# Patient Record
Sex: Male | Born: 1970 | Hispanic: Yes | Marital: Married | Smoking: Never smoker
Health system: Southern US, Community
[De-identification: ages and names within clinical notes are randomized; demographics above are authoritative.]

## PROBLEM LIST (undated history)

## (undated) HISTORY — PX: APPENDECTOMY: SHX54

---

## 2005-08-06 ENCOUNTER — Inpatient Hospital Stay: Payer: Self-pay | Admitting: Surgery

## 2005-08-13 ENCOUNTER — Emergency Department: Payer: Self-pay | Admitting: Emergency Medicine

## 2010-10-05 ENCOUNTER — Emergency Department: Payer: Self-pay

## 2013-12-26 ENCOUNTER — Emergency Department (HOSPITAL_COMMUNITY): Payer: No Typology Code available for payment source

## 2013-12-26 ENCOUNTER — Emergency Department (HOSPITAL_COMMUNITY)
Admission: EM | Admit: 2013-12-26 | Discharge: 2013-12-26 | Disposition: A | Discharge status: Home / Self Care | Payer: No Typology Code available for payment source | Attending: Emergency Medicine | Admitting: Emergency Medicine

## 2013-12-26 ENCOUNTER — Encounter (HOSPITAL_COMMUNITY): Payer: Self-pay | Admitting: Emergency Medicine

## 2013-12-26 DIAGNOSIS — Y9389 Activity, other specified: Secondary | ICD-10-CM | POA: Insufficient documentation

## 2013-12-26 DIAGNOSIS — IMO0002 Reserved for concepts with insufficient information to code with codable children: Secondary | ICD-10-CM | POA: Diagnosis not present

## 2013-12-26 DIAGNOSIS — M546 Pain in thoracic spine: Secondary | ICD-10-CM

## 2013-12-26 DIAGNOSIS — S0993XA Unspecified injury of face, initial encounter: Secondary | ICD-10-CM | POA: Insufficient documentation

## 2013-12-26 DIAGNOSIS — Y92410 Unspecified street and highway as the place of occurrence of the external cause: Secondary | ICD-10-CM | POA: Diagnosis not present

## 2013-12-26 DIAGNOSIS — S199XXA Unspecified injury of neck, initial encounter: Secondary | ICD-10-CM | POA: Diagnosis not present

## 2013-12-26 DIAGNOSIS — Z23 Encounter for immunization: Secondary | ICD-10-CM | POA: Diagnosis not present

## 2013-12-26 LAB — TROPONIN I

## 2013-12-26 LAB — CBC
HCT: 43.3 % (ref 39.0–52.0)
HEMOGLOBIN: 14.7 g/dL (ref 13.0–17.0)
MCH: 27.6 pg (ref 26.0–34.0)
MCHC: 33.9 g/dL (ref 30.0–36.0)
MCV: 81.2 fL (ref 78.0–100.0)
Platelets: 221 10*3/uL (ref 150–400)
RBC: 5.33 MIL/uL (ref 4.22–5.81)
RDW: 13.8 % (ref 11.5–15.5)
WBC: 7.5 10*3/uL (ref 4.0–10.5)

## 2013-12-26 LAB — URINALYSIS, ROUTINE W REFLEX MICROSCOPIC
BILIRUBIN URINE: NEGATIVE
GLUCOSE, UA: NEGATIVE mg/dL
KETONES UR: NEGATIVE mg/dL
Leukocytes, UA: NEGATIVE
Nitrite: NEGATIVE
PH: 6 (ref 5.0–8.0)
Protein, ur: NEGATIVE mg/dL
Specific Gravity, Urine: 1.01 (ref 1.005–1.030)
Urobilinogen, UA: 0.2 mg/dL (ref 0.0–1.0)

## 2013-12-26 LAB — BASIC METABOLIC PANEL
Anion gap: 12 (ref 5–15)
BUN: 10 mg/dL (ref 6–23)
CHLORIDE: 103 meq/L (ref 96–112)
CO2: 23 mEq/L (ref 19–32)
Calcium: 8.6 mg/dL (ref 8.4–10.5)
Creatinine, Ser: 0.87 mg/dL (ref 0.50–1.35)
GFR calc non Af Amer: >90 mL/min (ref >90)
GLUCOSE: 90 mg/dL (ref 70–99)
POTASSIUM: 3.9 meq/L (ref 3.7–5.3)
Sodium: 138 mEq/L (ref 137–147)

## 2013-12-26 LAB — URINE MICROSCOPIC-ADD ON

## 2013-12-26 MED ORDER — FLUORESCEIN SODIUM 1 MG OP STRP
1.0000 | ORAL_STRIP | Freq: Once | OPHTHALMIC | Status: AC
  Administered 2013-12-26: 1 via OPHTHALMIC
  Filled 2013-12-26: qty 1

## 2013-12-26 MED ORDER — TETANUS-DIPHTH-ACELL PERTUSSIS 5-2.5-18.5 LF-MCG/0.5 IM SUSP
0.5000 mL | Freq: Once | INTRAMUSCULAR | Status: AC
  Administered 2013-12-26: 0.5 mL via INTRAMUSCULAR
  Filled 2013-12-26: qty 0.5

## 2013-12-26 MED ORDER — IOHEXOL 300 MG/ML  SOLN
100.0000 mL | Freq: Once | INTRAMUSCULAR | Status: AC | PRN
  Administered 2013-12-26: 100 mL via INTRAVENOUS

## 2013-12-26 MED ORDER — SODIUM CHLORIDE 0.9 % IV BOLUS (SEPSIS)
1000.0000 mL | Freq: Once | INTRAVENOUS | Status: AC
  Administered 2013-12-26: 1000 mL via INTRAVENOUS

## 2013-12-26 MED ORDER — HYDROCODONE-ACETAMINOPHEN 5-325 MG PO TABS: 1.0000 | ORAL_TABLET | ORAL | Status: DC | PRN

## 2013-12-26 NOTE — ED Notes (Signed)
Pt transported to CT ?
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
## 2013-12-26 NOTE — ED Notes (Signed)
Pt requesting pain medication and a work note upon discharge.

## 2013-12-26 NOTE — ED Notes (Signed)
Orthostatic vitals complete

## 2013-12-26 NOTE — ED Notes (Signed)
Pt still with Radiology.
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
## 2013-12-26 NOTE — Discharge Instructions (Signed)
Dolor de espalda en el adulto °(Back Pain, Adult) ° El dolor de cintura es frecuente. Aproximadamente 1 de cada 5 personas lo sufren. La causa rara vez pone en peligro la vida. Con frecuencia mejora luego de algún tiempo. Alrededor de la mitad de las personas que sufren un inicio súbito de dolor de cintura, se sentirán mejor luego de 2 semanas. Aproximadamente 8 de cada 10 se sentirán mejor luego de 6 semanas.  °CAUSAS  °Algunas causas comunes son:  °· Distensión de los músculos o ligamentos que sostienen la columna vertebral. °· Desgaste (degeneración) de los discos vertebrales. °· Artritis. °· Traumatismos directos en la espalda. °DIAGNÓSTICO  °La mayor parte de las veces, la causa directa no se conoce. Sin embargo, el dolor puede tratarse efectivamente aún cuando no se conozca la causa. Una de las formas más precisas de asegurar que la causa del dolor no constituye un peligro es responder a las preguntas del médico acerca de su salud y sus síntomas. Si el médico necesita más información, podrá indicar análisis de laboratorio o realizar un diagnóstico por imágenes (radiografías o resonancia magnética). Sin embargo, aunque las imágenes muestren modificaciones, generalmente no es necesaria la cirugía.  °INSTRUCCIONES PARA EL CUIDADO EN EL HOGAR  °En algunas personas, el dolor de espalda vuelve. Como rara vez es peligroso, los pacientes pueden aprender a manejarlo ellos mismos.  °· Manténgase activo. Si permanece sentado o de pie mucho tiempo en el mismo lugar, se tensiona la espalda.  No se siente, maneje ni se quede parado en un mismo lugar por más de 30 minutos. Realice caminatas cortas en superficies planas ni bien el dolor haya cedido. Trate de aumentar cada día el tiempo que camina . °· No se quede en la cama. Si hace reposo durante más de 1 o 2 días, puede retrasar la recuperación. °· No evite los ejercicios ni el trabajo. El cuerpo está hecho para moverse. No es peligroso estar activo, aunque le duela la  espalda. La espalda se curará más rápido si continúa sus actividades antes de que el dolor se vaya. °· Preste atención a su cuerpo cuando se incline y se levante. Muchas personas sienten menos molestias cuando levantan objetos si doblan las rodillas, mantienen la carga cerca del cuerpo y evitan torcerse. Generalmente, las posiciones más cómodas son las que ejercen menos tensión en la espalda en recuperación. °· Encuentre una posición cómoda para dormir. Utilice un colchón firme y recuéstese de costado. Doble ligeramente sus rodillas. Si se recuesta sobre su espalda, coloque una almohada debajo de sus rodillas. °· Tome sólo medicamentos de venta libre o recetados, según las indicaciones del médico. Los medicamentos de venta libre para calmar el dolor y reducir la inflamación, son los que en general más ayudan. El médico podrá prescribirle relajantes musculares. Estos medicamentos calman el dolor de modo que pueda retornar a sus actividades normales y a realizar ejercicios saludables. °· Aplique hielo sobre la zona lesionada. °¨ Ponga el hielo en una bolsa plástica. °¨ Colóquese una toalla entre la piel y la bolsa de hielo. °¨ Deje la bolsa de hielo durante 15 a 20 minutos 3 a 4 veces por día, durante los primeros 2 ó 3 días. Luego podrá alternar entre calor y hielo para reducir el dolor y los espasmos. °· Consulte a su médico si puede tratar de hacer ejercicios para la espalda y recibir un masaje suave. Pueden ser beneficiosos. °· Evite sentirse ansioso o estresado. El estrés aumenta la tensión muscular y puede empeorar el dolor de espalda. Es importante reconocer cuando está ansioso o estresado y aprender la forma   de controlarlos. El ejercicio es una gran opción. °SOLICITE ATENCIÓN MÉDICA SI:  °· Siente un dolor que no se alivia con reposo o medicamentos. °· El dolor no mejora en 1 semana. °· Desarrolla nuevos síntomas. °· No se siente bien en general. °SOLICITE ATENCIÓN MÉDICA DE INMEDIATO SI: °· Siente un dolor  que se irradia desde la espalda hacia sus piernas. °· Desarrolla nuevos problemas en el intestino o la vejiga. °· Siente debilidad o adormecimiento inusual en sus brazos o piernas. °· Presenta náuseas o vómitos. °· Presenta dolor abdominal. °· Se siente desfalleciente. °Document Released: 04/05/2005 Document Revised: 10/05/2011 °[employer]® Patient Information ©2015 [employer]. This information is not intended to replace advice given to you by your health care provider. Make sure you discuss any questions you have with your health care provider. ° °

## 2013-12-26 NOTE — ED Notes (Signed)
Marissa, PA at bedside, c-spine cleared.

## 2013-12-27 NOTE — ED Provider Notes (Signed)
Medical screening examination/treatment/procedure(s) were performed by non-physician practitioner and as supervising physician I was immediately available for consultation/collaboration.   EKG Interpretation None        Shon Baton, MD 12/27/13 [phone]

## 2014-01-12 NOTE — ED Provider Notes (Signed)
Medical screening examination/treatment/procedure(s) were performed by non-physician practitioner and as supervising physician I was immediately available for consultation/collaboration.   EKG Interpretation   Date/Time:  Wednesday December 26 2013 16:44:02 EDT Ventricular Rate:  67 PR Interval:  175 QRS Duration: 95 QT Interval:  386 QTC Calculation: 407 R Axis:   -56 Text Interpretation:  Sinus rhythm LAD, consider left anterior fascicular  block ED PHYSICIAN INTERPRETATION AVAILABLE IN [facility] Confirmed  by TEST, Record ([id number]) on 12/28/2013 6:51:27 AM        Shon Baton, MD 01/12/14 [phone]

## 2015-04-13 ENCOUNTER — Emergency Department: Payer: Self-pay

## 2015-04-13 ENCOUNTER — Observation Stay
Admission: EM | Admit: 2015-04-13 | Discharge: 2015-04-14 | Disposition: A | Discharge status: Home / Self Care | Payer: Self-pay | Attending: Internal Medicine | Admitting: Internal Medicine

## 2015-04-13 ENCOUNTER — Encounter: Payer: Self-pay | Admitting: Emergency Medicine

## 2015-04-13 DIAGNOSIS — R112 Nausea with vomiting, unspecified: Secondary | ICD-10-CM | POA: Insufficient documentation

## 2015-04-13 DIAGNOSIS — K5730 Diverticulosis of large intestine without perforation or abscess without bleeding: Secondary | ICD-10-CM | POA: Insufficient documentation

## 2015-04-13 DIAGNOSIS — R55 Syncope and collapse: Principal | ICD-10-CM | POA: Insufficient documentation

## 2015-04-13 DIAGNOSIS — R079 Chest pain, unspecified: Secondary | ICD-10-CM | POA: Insufficient documentation

## 2015-04-13 DIAGNOSIS — K449 Diaphragmatic hernia without obstruction or gangrene: Secondary | ICD-10-CM | POA: Insufficient documentation

## 2015-04-13 DIAGNOSIS — I480 Paroxysmal atrial fibrillation: Secondary | ICD-10-CM | POA: Insufficient documentation

## 2015-04-13 DIAGNOSIS — J8410 Pulmonary fibrosis, unspecified: Secondary | ICD-10-CM | POA: Insufficient documentation

## 2015-04-13 DIAGNOSIS — K219 Gastro-esophageal reflux disease without esophagitis: Secondary | ICD-10-CM | POA: Insufficient documentation

## 2015-04-13 DIAGNOSIS — F10920 Alcohol use, unspecified with intoxication, uncomplicated: Secondary | ICD-10-CM

## 2015-04-13 DIAGNOSIS — K429 Umbilical hernia without obstruction or gangrene: Secondary | ICD-10-CM | POA: Insufficient documentation

## 2015-04-13 DIAGNOSIS — F10129 Alcohol abuse with intoxication, unspecified: Secondary | ICD-10-CM | POA: Insufficient documentation

## 2015-04-13 DIAGNOSIS — J322 Chronic ethmoidal sinusitis: Secondary | ICD-10-CM | POA: Insufficient documentation

## 2015-04-13 LAB — HEPATIC FUNCTION PANEL
ALBUMIN: 4.3 g/dL (ref 3.5–5.0)
ALT: 23 U/L (ref 17–63)
AST: 22 U/L (ref 15–41)
Alkaline Phosphatase: 86 U/L (ref 38–126)
Bilirubin, Direct: <0.1 mg/dL — ABNORMAL LOW (ref 0.1–0.5)
TOTAL PROTEIN: 7.6 g/dL (ref 6.5–8.1)
Total Bilirubin: 0.6 mg/dL (ref 0.3–1.2)

## 2015-04-13 LAB — CBC
HCT: 44.5 % (ref 40.0–52.0)
Hemoglobin: 14.6 g/dL (ref 13.0–18.0)
MCH: 27 pg (ref 26.0–34.0)
MCHC: 32.8 g/dL (ref 32.0–36.0)
MCV: 82.2 fL (ref 80.0–100.0)
PLATELETS: 243 10*3/uL (ref 150–440)
RBC: 5.41 MIL/uL (ref 4.40–5.90)
RDW: 14.2 % (ref 11.5–14.5)
WBC: 5.9 10*3/uL (ref 3.8–10.6)

## 2015-04-13 LAB — BASIC METABOLIC PANEL
ANION GAP: 8 (ref 5–15)
BUN: 10 mg/dL (ref 6–20)
CO2: 24 mmol/L (ref 22–32)
Calcium: 8.5 mg/dL — ABNORMAL LOW (ref 8.9–10.3)
Chloride: 110 mmol/L (ref 101–111)
Creatinine, Ser: 0.87 mg/dL (ref 0.61–1.24)
Glucose, Bld: 148 mg/dL — ABNORMAL HIGH (ref 65–99)
POTASSIUM: 3.6 mmol/L (ref 3.5–5.1)
SODIUM: 142 mmol/L (ref 135–145)

## 2015-04-13 LAB — LIPASE, BLOOD: Lipase: 17 U/L (ref 11–51)

## 2015-04-13 LAB — ETHANOL: ALCOHOL ETHYL (B): 188 mg/dL — AB (ref <5)

## 2015-04-13 LAB — TROPONIN I

## 2015-04-13 MED ORDER — SODIUM CHLORIDE 0.9 % IV BOLUS (SEPSIS)
1000.0000 mL | Freq: Once | INTRAVENOUS | Status: AC
  Administered 2015-04-13: 1000 mL via INTRAVENOUS

## 2015-04-13 MED ORDER — PROMETHAZINE HCL 25 MG/ML IJ SOLN
25.0000 mg | Freq: Once | INTRAMUSCULAR | Status: AC
  Administered 2015-04-13: 25 mg via INTRAVENOUS

## 2015-04-13 MED ORDER — PROMETHAZINE HCL 25 MG/ML IJ SOLN
INTRAMUSCULAR | Status: AC
  Administered 2015-04-13: 25 mg via INTRAVENOUS
  Filled 2015-04-13: qty 1

## 2015-04-13 MED ORDER — LORAZEPAM 1 MG PO TABS: 1.0000 mg | ORAL_TABLET | Freq: Four times a day (QID) | ORAL | Status: DC | PRN

## 2015-04-13 MED ORDER — ONDANSETRON HCL 4 MG/2ML IJ SOLN
INTRAMUSCULAR | Status: AC
  Administered 2015-04-13: 4 mg via INTRAVENOUS
  Filled 2015-04-13: qty 2

## 2015-04-13 MED ORDER — ONDANSETRON HCL 4 MG/2ML IJ SOLN
INTRAMUSCULAR | Status: AC
  Administered 2015-04-13: 16:00:00
  Filled 2015-04-13: qty 2

## 2015-04-13 MED ORDER — ONDANSETRON HCL 4 MG/2ML IJ SOLN
4.0000 mg | Freq: Once | INTRAMUSCULAR | Status: AC
  Administered 2015-04-13: 4 mg via INTRAVENOUS

## 2015-04-13 MED ORDER — LORAZEPAM 2 MG/ML IJ SOLN: 1.0000 mg | Freq: Four times a day (QID) | INTRAMUSCULAR | Status: DC | PRN

## 2015-04-13 MED ORDER — THIAMINE HCL 100 MG/ML IJ SOLN
INTRAVENOUS | Status: DC
  Filled 2015-04-13 (×2): qty 1000

## 2015-04-13 MED ORDER — IOHEXOL 350 MG/ML SOLN
100.0000 mL | Freq: Once | INTRAVENOUS | Status: AC | PRN
  Administered 2015-04-13: 100 mL via INTRAVENOUS

## 2015-04-13 MED ORDER — THIAMINE HCL 100 MG/ML IJ SOLN
INTRAVENOUS | Status: DC
  Administered 2015-04-14 (×2): via INTRAVENOUS
  Filled 2015-04-13 (×3): qty 1000

## 2015-04-13 MED ORDER — SODIUM CHLORIDE 0.9 % IV BOLUS (SEPSIS): 1000.0000 mL | Freq: Once | INTRAVENOUS | Status: AC

## 2015-04-13 NOTE — ED Provider Notes (Signed)
[facility] Emergency Department Provider Note   ____________________________________________  Time seen: Upon arrival to ED room I have reviewed the triage vital signs and the triage nursing note.  HISTORY  Chief Complaint Emesis   Historian Patient, through Spanish interpreter  HPI Christopher Ramos is a 44 y.o. male with no significant past medical history, here for vomiting and passing out. He reportedly does not drink on a daily basis, but did drink alcohol yesterday and this morning woke up heavily nausea with multiple episodes of vomiting, nonbloody and nonbilious. No fever. No trouble breathing or shortness of breath.  Complaining of some central chest pressure and pain. He passed out twice at home while retching. Denies recent fevers. Has a history of diverticulitis, but has not really been complaining about lower abdominal pain    History reviewed. No pertinent past medical history.  There are no active problems to display for this patient.   Past Surgical History  Procedure Laterality Date  . Appendectomy      Current Outpatient Rx  Name  Route  Sig  Dispense  Refill  . HYDROcodone-acetaminophen (NORCO/VICODIN) 5-325 MG per tablet   Oral   Take 1 tablet by mouth every 4 (four) hours as needed for moderate pain or severe pain.   10 tablet   0     Allergies Review of patient's allergies indicates no known allergies.  No family history on file.  Social History Social History  Substance Use Topics  . Smoking status: Never Smoker   . Smokeless tobacco: None  . Alcohol Use: Yes    Review of Systems  Constitutional: Negative for fever. Eyes: Negative for visual changes. ENT: Negative for sore throat. Cardiovascular: positivefor chest pain. Respiratory: Negative for shortness of breath. Gastrointestinal: negative for diarrhea. Genitourinary: Negative for dysuria. Musculoskeletal: Negative for back pain. Skin: Negative for  rash. Neurological: Hospitalfor headache. 10 point Review of Systems otherwise negative ____________________________________________   PHYSICAL EXAM:  VITAL SIGNS: ED Triage Vitals  Enc Vitals Group     BP 04/13/15 1502 120/77 mmHg     Pulse Rate 04/13/15 1500 95     Resp 04/13/15 1500 22     Temp 04/13/15 1502 98.7 F (37.1 C)     Temp Source 04/13/15 1502 Axillary     SpO2 04/13/15 1459 98 %     Weight 04/13/15 1502 150 lb (68.04 kg)     Height 04/13/15 1502 5\' 6"  (1.676 m)     Head Cir --      Peak Flow --      Pain Score --      Pain Loc --      Pain Edu? --      Excl. in GC? --      Constitutional: Alert and oriented. Actively retching. Eyes: Conjunctivae are normal. PERRL. Normal extraocular movements. ENT   Head: Normocephalic and atraumatic.   Nose: No congestion/rhinnorhea.   Mouth/Throat: Mucous membranes are moist.   Neck: No stridor. Cardiovascular/Chest: Normal rate, regular rhythm.  No murmurs, rubs, or gallops. Respiratory: Normal respiratory effort without tachypnea nor retractions. Breath sounds are clear and equal bilaterally. No wheezes/rales/rhonchi. Gastrointestinal: Soft. No distention, no guarding, no rebound.  Mild epigastric tenderness palpation.  Genitourinary/rectal:Deferred Musculoskeletal: Nontender with normal range of motion in all extremities. No joint effusions.  No lower extremity tenderness.  No edema. Neurologic:  Normal speech and language. No gross or focal neurologic deficits are appreciated. Skin:  Skin is warm, dry and intact.  No rash noted. Psychiatric: Mood and affect are normal. Speech and behavior are normal. Patient exhibits appropriate insight and judgment.  ____________________________________________   EKG I, Governor Rooks, MD, the attending physician have personally viewed and interpreted all ECGs.  95 bpm. Normal sinus rhythm. Narrow QRS.Left axis deviation.  Nonspecific ST and  T-wave ____________________________________________  LABS (pertinent positives/negatives)  CBC within normal limits Metabolic panel without significant abnormalities Troponin less than 0.03 Lipase 17 Hepatic panel within normal limits Alcohol level 188  ____________________________________________  RADIOLOGY All Xrays were viewed by me. Imaging interpreted by Radiologist.  CTA chest abdomen pelvis:  IMPRESSION: No CT evidence of pulmonary embolism or aortic dissection.  Apparent diffuse thickening of the colon with submucosal fat deposit which may be sequela of chronic inflammation. Clinical correlation is recommended to exclude active colitis.  Scattered colonic diverticula without active inflammation. No bowel Obstruction.  CT head noncontrast: No acute intracranial abnormality. Mild chronic ethmoid sinusitis.  Chest x-ray two-view: Negative two-view chest x-ray. __________________________________________  PROCEDURES  Procedure(s) performed: None  Critical Care performed:CRITICAL CARE Performed by: Governor Rooks   Total critical care time: 30 minutes  Critical care time was exclusive of separately billable procedures and treating other patients.  Critical care was necessary to treat or prevent imminent or life-threatening deterioration.  Critical care was time spent personally by me on the following activities: development of treatment plan with patient and/or surrogate as well as nursing, discussions with consultants, evaluation of patient's response to treatment, examination of patient, obtaining history from patient or surrogate, ordering and performing treatments and interventions, ordering and review of laboratory studies, ordering and review of radiographic studies, pulse oximetry and re-evaluation of patient's condition.   ____________________________________________   ED COURSE / ASSESSMENT AND PLAN  CONSULTATIONS: hospitalist for  admission  Pertinent labs & imaging results that were available during my care of the patient were reviewed by me and considered in my medical decision making (see chart for details).  Patient arrived actively retching, and had an episode of near syncope when his heart rate dropped into the 20s while he was retching. It seemed consistent with a vagal event. Initially unclear what the etiology of the many episodes of vomiting was from, however family states he did drink heavily last night celebrating Christmas Eve with his family. He does not have a history of drinking daily.  He required multiple repeat doses of antinausea medication, and did have another event where his heart rate dropped down into the 20s. This was in the setting of vomiting, I am most suspicious that this was part of a vagal event, however patient was placed on cardiac pads given 2 episodes very close together, and some altered mental status during that episode.  Head CT was performed given the persistent vomiting and headache, and there is no evidence of intracranial abnormality/emergency.  Patient's alcohol level came back elevated, and I suspect that his symptoms may be due to gastritis, however he required additional doses of nausea medicine.  He did report chest pain and epigastric pain, and he has had some irregularity in his heart rhythm including an episode of paroxysmal atrial fibrillation, for which she states he has no history of this.  I was unable to obtain an EKG, however I was in the room and witnessed an episode of atrial fibrillation with heart rate to the 120s.   I discussed with the family obtaining CT of the chest abdomen pelvis to rule out any vascular emergency.  CT of the chest  and pelvis was reassuring/no sign of vascular abnormalities. There is some question about possible colon inflammation, however patient has no lower abdominal pain, diarrhea, fever, or light white blood cell count I don't these haven't an  acute colitis.  For syncope, and intractable vomiting, paroxysmal atrial fibrillation episode, patient will be admitted for observation.  Patient / Family / Caregiver informed of clinical course, medical decision-making process, and agree with plan.    ___________________________________________   FINAL CLINICAL IMPRESSION(S) / ED DIAGNOSES   Final diagnoses:  Intractable vomiting with nausea, unspecified vomiting type  Alcohol intoxication, uncomplicated (HCC)  Syncope, unspecified syncope type  Nonspecific chest pain  Paroxysmal a-fib (HCC)       Governor Rooks, MD 04/13/15 2040

## 2015-04-13 NOTE — ED Notes (Signed)
Dr. Shaune PollackLord at bedside, interpreter utilized

## 2015-04-13 NOTE — H&P (Signed)
[facility] [place] [facility]   PATIENT NAME: Christopher Ramos    MR#:  [id number]  DATE OF BIRTH:  10/27/1970  DATE OF ADMISSION:  04/13/2015  PRIMARY CARE PHYSICIAN: No primary care provider on file.   REQUESTING/REFERRING PHYSICIAN: Lord  CHIEF COMPLAINT:  Nausea vomiting and passing out  HISTORY OF PRESENT ILLNESS:  Christopher Ramos  is a 44 y.o. male with no known medical problems presenting to the ED with a chief complaint of intractable nausea and vomiting and passing out.. According to his wife patient drinks liquor 2-3 bottles per day and his last drink was at 7:30 AM today with other friends following which he started having intractable nausea and vomiting which was nonbloody. No diarrhea or abdominal pain according to the wife's report. Patient is very lethargic and not answering any questions. He is arousable but falling asleep. Alcohol level is at 120s. CT head is negative. Initial troponin was also negative  PAST MEDICAL HISTORY:  History reviewed. No pertinent past medical history.  PAST SURGICAL HISTOIRY:   Past Surgical History  Procedure Laterality Date  . Appendectomy      SOCIAL HISTORY:   Social History  Substance Use Topics  . Smoking status: Never Smoker   . Smokeless tobacco: Not on file  . Alcohol Use: Yes    FAMILY HISTORY:  No family history on file.  DRUG ALLERGIES:  No Known Allergies  REVIEW OF SYSTEMS:  Review of systems unobtainable as the patient is lethargic from alcohol intoxication  MEDICATIONS AT HOME:   Prior to Admission medications   Not on File      VITAL SIGNS:  Blood pressure 90/61, pulse 91, temperature 98.7 F (37.1 C), temperature source Axillary, resp. rate 14, height 5\' 6"  (1.676 m), weight 68.04 kg (150 lb), SpO2 98 %.  PHYSICAL EXAMINATION:  GENERAL:  44 y.o.-year-old patient lying in the bed with no acute distress.  EYES: Pupils equal, round, reactive to light and  accommodation. No scleral icterus.  HEENT: Head atraumatic, normocephalic. Oropharynx and nasopharynx clear.  NECK:  Supple, no jugular venous distention. No thyroid enlargement, no tenderness.  LUNGS: Normal breath sounds bilaterally, no wheezing, rales,rhonchi or crepitation. No use of accessory muscles of respiration.  CARDIOVASCULAR: S1, S2 normal. No murmurs, rubs, or gallops.  ABDOMEN: Soft, nontender, nondistended. Bowel sounds present. No organomegaly or mass.  EXTREMITIES: No pedal edema, cyanosis, or clubbing.  NEUROLOGIC: Patient is lethargic  PSYCHIATRIC: The patient is Lethargic  SKIN: No obvious rash, lesion, or ulcer.   LABORATORY PANEL:   CBC  Recent Labs Lab 04/13/15 1459  WBC 5.9  HGB 14.6  HCT 44.5  PLT 243   ------------------------------------------------------------------------------------------------------------------  Chemistries   Recent Labs Lab 04/13/15 1459  NA 142  K 3.6  CL 110  CO2 24  GLUCOSE 148*  BUN 10  CREATININE 0.87  CALCIUM 8.5*  AST 22  ALT 23  ALKPHOS 86  BILITOT 0.6   ------------------------------------------------------------------------------------------------------------------  Cardiac Enzymes  Recent Labs Lab 04/13/15 1459  TROPONINI <0.03   ------------------------------------------------------------------------------------------------------------------  RADIOLOGY:  Dg Chest 2 View  04/13/2015  CLINICAL DATA:  Nausea and syncope. EXAM: CHEST - 2 VIEW COMPARISON:  One-view chest x-ray 08/25/2013 FINDINGS: The heart size and mediastinal contours are within normal limits. Both lungs are clear. The visualized skeletal structures are unremarkable. IMPRESSION: Negative two view chest x-ray Electronically Signed   By: Marin Robertshristopher  Mattern M.D.   On: 04/13/2015 15:50   Ct Head  Wo Contrast  04/13/2015  CLINICAL DATA:  Syncopal episode today.  Atrial fibrillation. EXAM: CT HEAD WITHOUT CONTRAST TECHNIQUE: Contiguous  axial images were obtained from the base of the skull through the vertex without intravenous contrast. COMPARISON:  CT of the head dated 12/26/2013 FINDINGS: Brain: No evidence of acute infarction, hemorrhage, extra-axial collection, ventriculomegaly, or mass effect. Vascular: No hyperdense vessel or unexpected calcification. Skull: Negative for fracture or focal lesion. Sinuses/Orbits: Minimal polypoid mucosal thickening of the ethmoid sinuses. No acute findings otherwise. Other: None. IMPRESSION: No acute intracranial abnormality. Mild chronic ethmoid sinusitis. Electronically Signed   By: Ted Mcalpine M.D.   On: 04/13/2015 15:40   Ct Angio Chest Aorta W/cm &/or Wo/cm  04/13/2015  CLINICAL DATA:  44 year old male with chest pain and persistent nausea and vomiting. EXAM: CT ANGIOGRAPHY CHEST, ABDOMEN AND PELVIS TECHNIQUE: Multidetector CT imaging through the chest, abdomen and pelvis was performed using the standard protocol during bolus administration of intravenous contrast. Multiplanar reconstructed images and MIPs were obtained and reviewed to evaluate the vascular anatomy. CONTRAST:  OMNIPAQUE IOHEXOL 350 MG/ML SOLN COMPARISON:  CT dated 12/26/2013 FINDINGS: CTA CHEST FINDINGS The lungs are clear. A small calcified granuloma noted in the right lower lobe. There is no pleural effusion or pneumothorax. The central airways are patent. The thoracic aorta appears unremarkable. No CT evidence of pulmonary embolism. There is no cardiomegaly or pericardial effusion. There is no hilar or mediastinal adenopathy. Small amount of fluid noted in the distal esophagus, likely gastroesophageal reflux. The esophagus is otherwise grossly unremarkable. The thyroid gland appears unremarkable. There is no axillary adenopathy. The chest wall soft tissues appear unremarkable. No acute fracture Review of the MIP images confirms the above findings. CTA ABDOMEN AND PELVIS FINDINGS No intra-abdominal free air or free  fluid. The liver, gallbladder, pancreas, spleen, adrenal glands appear unremarkable. There is no hydronephrosis on either side. The visualized ureters and urinary bladder appear unremarkable. The prostate and seminal vesicles are grossly unremarkable. There is a small hiatal hernia. There scattered colonic diverticula without active inflammation. There is apparent diffuse thickening of the wall of the colon with submucosal fat deposit, likely related to chronic inflammation. No pericolonic stranding or definite evidence of active inflammation identified. Correlation with clinical exam is recommended to exclude active colitis. Is no evidence of bowel obstruction. Appendectomy. The abdominal aorta appears unremarkable. There is no aortic dissection or aneurysm. The origins of the celiac axis, SMA, IMA as well as the origins of the renal arteries are patent. 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 There is a replaced hepatic artery anatomy. The right hepatic artery arises from the SMA which then bifurcates and gives a small branch to the left lobe of the liver. There is also a small branch arising from the left gastric artery and extending into the left lobe of the liver. The IVC appears patent. No portal venous gas identified. There is no adenopathy. There is diastases of anterior abdominal wall musculature in the midline with a small fat containing umbilical hernia. The abdominal wall soft tissues are otherwise unremarkable. The osseous structures are intact. Review of the MIP images confirms the above findings. IMPRESSION: No CT evidence of pulmonary embolism or aortic dissection. Apparent diffuse thickening of the colon with submucosal fat deposit which may be sequela of chronic inflammation. Clinical correlation is recommended to exclude active colitis. Scattered colonic diverticula without active inflammation. No bowel obstruction. Electronically Signed   By: Burtis Junes  Radparvar M.D.   On: 04/13/2015 19:04   Ct Cta Abd/pel W/cm &/or W/o Cm  04/13/2015  CLINICAL DATA:  44 year old male with chest pain and persistent nausea and vomiting. EXAM: CT ANGIOGRAPHY CHEST, ABDOMEN AND PELVIS TECHNIQUE: Multidetector CT imaging through the chest, abdomen and pelvis was performed using the standard protocol during bolus administration of intravenous contrast. Multiplanar reconstructed images and MIPs were obtained and reviewed to evaluate the vascular anatomy. CONTRAST:  OMNIPAQUE IOHEXOL 350 MG/ML SOLN COMPARISON:  CT dated 12/26/2013 FINDINGS: CTA CHEST FINDINGS The lungs are clear. A small calcified granuloma noted in the right lower lobe. There is no pleural effusion or pneumothorax. The central airways are patent. The thoracic aorta appears unremarkable. No CT evidence of pulmonary embolism. There is no cardiomegaly or pericardial effusion. There is no hilar or mediastinal adenopathy. Small amount of fluid noted in the distal esophagus, likely gastroesophageal reflux. The esophagus is otherwise grossly unremarkable. The thyroid gland appears unremarkable. There is no axillary adenopathy. The chest wall soft tissues appear unremarkable. No acute fracture Review of the MIP images confirms the above findings. CTA ABDOMEN AND PELVIS FINDINGS No intra-abdominal free air or free fluid. The liver, gallbladder, pancreas, spleen, adrenal glands appear unremarkable. There is no hydronephrosis on either side. The visualized ureters and urinary bladder appear unremarkable. The prostate and seminal vesicles are grossly unremarkable. There is a small hiatal hernia. There scattered colonic diverticula without active inflammation. There is apparent diffuse thickening of the wall of the colon with submucosal fat deposit, likely related to chronic inflammation. No pericolonic stranding or definite evidence of active inflammation identified. Correlation with clinical exam is recommended to  exclude active colitis. Is no evidence of bowel obstruction. Appendectomy. The abdominal aorta appears unremarkable. There is no aortic dissection or aneurysm. The origins of the celiac axis, SMA, IMA as well as the origins of the renal arteries are patent. 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 There is a replaced hepatic artery anatomy. The right hepatic artery arises from the SMA which then bifurcates and gives a small branch to the left lobe of the liver. There is also a small branch arising from the left gastric artery and extending into the left lobe of the liver. The IVC appears patent. No portal venous gas identified. There is no adenopathy. There is diastases of anterior abdominal wall musculature in the midline with a small fat containing umbilical hernia. The abdominal wall soft tissues are otherwise unremarkable. The osseous structures are intact. Review of the MIP images confirms the above findings. IMPRESSION: No CT evidence of pulmonary embolism or aortic dissection. Apparent diffuse thickening of the colon with submucosal fat deposit which may be sequela of chronic inflammation. Clinical correlation is recommended to exclude active colitis. Scattered colonic diverticula without active inflammation. No bowel obstruction. Electronically Signed   By: Elgie Collard M.D.   On: 04/13/2015 19:04    EKG:   Orders placed or performed during the hospital encounter of 12/26/13  . EKG 12-Lead  . EKG 12-Lead  . EKG    IMPRESSION AND PLAN:   1. Syncope-probably vasovagal from nausea and vomiting Aggressive hydration with IV fluids with banana bag Monitor patient on telemetry Cycle cardiac biomarkers Check orthostatics, neuro checks. Get echocardiogram PT consult  2. Intractable nausea and vomiting probably from alcohol intoxication Provide symptomatic treatment with IV fluids, antiemetics CIWA protocol Patient will be benefited with  outpatient alcohol rehabilitation  3. Paroxysmal atrial  fibrillation-back to sinus rhythm now Check echocardiogram and monitor patient on telemetry Check TSH if needed but a cardiology consult   4. Acute alcohol intoxication  CIWA protocol IV fluids with multivitamin, thiamine and folate  Patient will be benefited with outpatient alcohol rehabilitation   Provide GI prophylaxis and DVT prophylaxis  All the records are reviewed and case discussed with ED provider. Management plans discussed with the  patient's wife at bedside with the help of Spanish interpreter mr. Isaias and she is  in agreement.  CODE STATUS: full code, wife is the healthcare power of attorney  TOTAL TIME TAKING CARE OF THIS PATIENT: 45 minutes    Rosha Cocker M.D on 04/13/2015 at 11:09 PM  Between 7am to 6pm - Pager - [phone]  After 6pm go to [email or web address] - password [email or web address] [facility]  [facility]  Office  [phone]  CC: Primary care physician; No primary care provider on file.

## 2015-04-13 NOTE — ED Notes (Signed)
Per EMS:  Nausea since waking up this morning, s/p drinking 1 1/2 fifths of taquela last night.  Patient appears alert and oriented. Started throwing up when he arrived to our facility with EMS, was only nauseated prior to that.  Emesis is blood tinged.  Denies pain.  VS and CBG are WNL, afebrile.

## 2015-04-13 NOTE — ED Notes (Signed)
Patient vomited, his HR decreased to 28 and he lost conciousness.  Patient quickly regained consciousness and his HR increased to 90s.  Patient is now in afib.

## 2015-04-14 ENCOUNTER — Observation Stay
Admit: 2015-04-14 | Discharge: 2015-04-14 | Disposition: A | Discharge status: Home / Self Care | Payer: Self-pay | Attending: Internal Medicine | Admitting: Internal Medicine

## 2015-04-14 LAB — COMPREHENSIVE METABOLIC PANEL
ALBUMIN: 3.7 g/dL (ref 3.5–5.0)
ALK PHOS: 71 U/L (ref 38–126)
ALT: 19 U/L (ref 17–63)
ANION GAP: 7 (ref 5–15)
AST: 19 U/L (ref 15–41)
BUN: 13 mg/dL (ref 6–20)
CALCIUM: 7.9 mg/dL — AB (ref 8.9–10.3)
CO2: 24 mmol/L (ref 22–32)
Chloride: 112 mmol/L — ABNORMAL HIGH (ref 101–111)
Creatinine, Ser: 0.86 mg/dL (ref 0.61–1.24)
GFR calc Af Amer: >60 mL/min (ref >60)
GFR calc non Af Amer: >60 mL/min (ref >60)
GLUCOSE: 124 mg/dL — AB (ref 65–99)
Potassium: 3.6 mmol/L (ref 3.5–5.1)
SODIUM: 143 mmol/L (ref 135–145)
Total Bilirubin: 0.7 mg/dL (ref 0.3–1.2)
Total Protein: 6.3 g/dL — ABNORMAL LOW (ref 6.5–8.1)

## 2015-04-14 LAB — CBC
HEMATOCRIT: 39.7 % — AB (ref 40.0–52.0)
HEMOGLOBIN: 13.5 g/dL (ref 13.0–18.0)
MCH: 28 pg (ref 26.0–34.0)
MCHC: 34 g/dL (ref 32.0–36.0)
MCV: 82.5 fL (ref 80.0–100.0)
Platelets: 221 10*3/uL (ref 150–440)
RBC: 4.81 MIL/uL (ref 4.40–5.90)
RDW: 14.2 % (ref 11.5–14.5)
WBC: 7.5 10*3/uL (ref 3.8–10.6)

## 2015-04-14 LAB — TROPONIN I
Troponin I: <0.03 ng/mL (ref <0.031)
Troponin I: <0.03 ng/mL (ref <0.031)

## 2015-04-14 LAB — ETHANOL: ALCOHOL ETHYL (B): 15 mg/dL — AB (ref <5)

## 2015-04-14 LAB — PROTIME-INR
INR: 1.09
Prothrombin Time: 14.3 seconds (ref 11.4–15.0)

## 2015-04-14 LAB — TSH: TSH: 0.408 u[IU]/mL (ref 0.350–4.500)

## 2015-04-14 MED ORDER — ASPIRIN EC 81 MG PO TBEC
81.0000 mg | DELAYED_RELEASE_TABLET | Freq: Every day | ORAL | Status: DC
  Administered 2015-04-14: 81 mg via ORAL
  Filled 2015-04-14: qty 1

## 2015-04-14 MED ORDER — ENOXAPARIN SODIUM 40 MG/0.4ML ~~LOC~~ SOLN
40.0000 mg | Freq: Every day | SUBCUTANEOUS | Status: DC
  Administered 2015-04-14: 40 mg via SUBCUTANEOUS
  Filled 2015-04-14: qty 0.4

## 2015-04-14 MED ORDER — ONDANSETRON HCL 4 MG/2ML IJ SOLN: 4.0000 mg | Freq: Four times a day (QID) | INTRAMUSCULAR | Status: DC | PRN

## 2015-04-14 MED ORDER — ONDANSETRON HCL 4 MG PO TABS: 4.0000 mg | ORAL_TABLET | Freq: Four times a day (QID) | ORAL | Status: DC | PRN

## 2015-04-14 NOTE — Care Management Note (Signed)
Case Management Note  Patient Details  Name: Christopher Ramos MRN: [id number] Date of Birth: 1971/03/10  Subjective/Objective:   RNCM assessment for patient without insurance. Met with patient, wife and medical interpreter Christopher Ramos) at bedside. Spanish applications given for medication management clinic and open door clinic. Explained to all that patient could complete application for medication management and pick medications up at discharge. ETOH abuse. Prior to admission patient was independent and active. No additional needs identified. Case Closed.                 Action/Plan:   Expected Discharge Date:                  Expected Discharge Plan:  Home/Self Care  In-House Referral:  Interpreting Services  Discharge planning Services  CM Consult, [facility], Medication Assistance  Post Acute Care Choice:    Choice offered to:  Patient, Spouse  DME Arranged:    DME Agency:     HH Arranged:    [place] Agency:     Status of Service:  Completed, signed off  Medicare Important Message Given:    Date Medicare IM Given:    Medicare IM give by:    Date Additional Medicare IM Given:    Additional Medicare Important Message give by:     If discussed at [place] of Stay Meetings, dates discussed:    Additional Comments:  Jolly Mango, RN 04/14/2015, 9:43 AM

## 2015-04-14 NOTE — Discharge Instructions (Signed)

## 2015-04-14 NOTE — Evaluation (Signed)
Physical Therapy Evaluation Patient Details Name: Takuya Lariccia MRN: [id number] DOB: 1970/08/31 Today's Date: 04/14/2015   History of Present Illness  Patient is a 44 y/o male that presents after experiencing nausea/vomiting and passing out after excessive alcohol consumption. Per wife present patient's BP drops when he experiences emesis, resulting in him losing consciousness.   Clinical Impression  Patient experienced a syncopal episode yesterday and was noted to have decreased HR in emergency department. Patient is independent as baseline, and demonstrates no deficits in balance or gait speed in this session. Patient does not demonstrate any decline in mobility or function during this hospitalization. PT identified no needs and will sign off.     Follow Up Recommendations No PT follow up    Equipment Recommendations       Recommendations for Other Services       Precautions / Restrictions Precautions Precautions: None Restrictions Weight Bearing Restrictions: No      Mobility  Bed Mobility Overal bed mobility: Independent             General bed mobility comments: No deficits noted.   Transfers Overall transfer level: Independent               General transfer comment: No deficits noted.   Ambulation/Gait Ambulation/Gait assistance: Independent Ambulation Distance (Feet): 200 Feet   Gait Pattern/deviations: WFL(Within Functional Limits)   Gait velocity interpretation: at or above normal speed for age/gender General Gait Details: No gait deficits, modified DGI 12/12   Stairs            Wheelchair Mobility    Modified Rankin (Stroke Patients Only)       Balance Overall balance assessment: Independent                                           Pertinent Vitals/Pain Pain Assessment: No/denies pain    Home Living Family/patient expects to be discharged to:: Private residence Living Arrangements:  Spouse/significant other Available Help at Discharge: Family           Home Equipment: None      Prior Function Level of Independence: Independent         Comments: No falls aside from the current episode, one fall last year for similar episode.      Hand Dominance        Extremity/Trunk Assessment   Upper Extremity Assessment: Overall WFL for tasks assessed           Lower Extremity Assessment: Overall WFL for tasks assessed         Communication   Communication: Interpreter utilized Best boy interpreter. )  Cognition Arousal/Alertness: Awake/alert Behavior During Therapy: WFL for tasks assessed/performed Overall Cognitive Status: Within Functional Limits for tasks assessed                      General Comments      Exercises        Assessment/Plan    PT Assessment Patent does not need any further PT services  PT Diagnosis     PT Problem List    PT Treatment Interventions     PT Goals (Current goals can be found in the Care Plan section) Acute Rehab PT Goals Patient Stated Goal: To return home  PT Goal Formulation: With patient Time For Goal Achievement: 04/28/15 Potential to Achieve Goals: Good  Frequency     Barriers to discharge        Co-evaluation               End of Session Equipment Utilized During Treatment: Gait belt Activity Tolerance: Patient tolerated treatment well        Functional Assessment Tool Used: Modified DGI, Clinical judgement  Functional Limitation: Mobility: Walking and moving around Mobility: Walking and Moving Around Current Status [phone](G8978): 0 percent impaired, limited or restricted Mobility: Walking and Moving Around Discharge Status [phone](G8980): 0 percent impaired, limited or restricted    Time: [id number] PT Time Calculation (min) (ACUTE ONLY): 6 min   Charges:   PT Evaluation $Initial PT Evaluation Tier I: 1 Procedure     PT G Codes:   PT G-Codes **NOT FOR INPATIENT  CLASS** Functional Assessment Tool Used: Modified DGI, Clinical judgement  Functional Limitation: Mobility: Walking and moving around Mobility: Walking and Moving Around Current Status ([id number](G8978): 0 percent impaired, limited or restricted Mobility: Walking and Moving Around Discharge Status ([id number](G8980): 0 percent impaired, limited or restricted   Kerin RansomPatrick A McNamara, PT, DPT    04/14/2015, 9:52 AM

## 2015-04-14 NOTE — Discharge Summary (Signed)
[facility] - [place] at [facility]  DISCHARGE SUMMARY   PATIENT NAME: Christopher Ramos    MR#:  [id number]  DATE OF BIRTH:  06/17/1970  DATE OF ADMISSION:  04/13/2015 ADMITTING PHYSICIAN: Ramonita Lab, MD  DATE OF DISCHARGE: 04/14/2015  PRIMARY CARE PHYSICIAN: No primary care provider on file.    ADMISSION DIAGNOSIS:  Paroxysmal a-fib (HCC) [I48.0] Nonspecific chest pain [R07.9] Alcohol intoxication, uncomplicated (HCC) [F10.120] Syncope, unspecified syncope type [R55] Intractable vomiting with nausea, unspecified vomiting type [R11.2]  DISCHARGE DIAGNOSIS:  Active Problems:   Syncope   SECONDARY DIAGNOSIS:  History reviewed. No pertinent past medical history.  HOSPITAL COURSE:   1. Syncope - Likely due to intoxication, vomiting, vagal response - Cardiac enzymes negative, echo normal, blood pressure normal at the time of discharge after hydration, no events on telemetry  2. Nausea and vomiting - Due to alcohol intoxication  3. Paroxysmal atrial fibrillation - Not seen since emergency room. Now normal sinus rhythm. Echo normal. TSH normal.  4. Acute alcohol intoxication - Resolved. Discussed with patient and his wife. They have declined invitation for further alcohol counseling.  Conversations had with the assistance of an interpreter  DISCHARGE CONDITIONS:   Stable  CONSULTS OBTAINED:      None  DRUG ALLERGIES:  No Known Allergies  DISCHARGE MEDICATIONS:  There are no discharge medications for this patient.  DISCHARGE INSTRUCTIONS:    DIET:  Regular diet  DISCHARGE CONDITION:  Stable  ACTIVITY:  Activity as tolerated  OXYGEN:  Home Oxygen: No.   Oxygen Delivery: room air  DISCHARGE LOCATION:  home   If you experience worsening of your admission symptoms, develop shortness of breath, life threatening emergency, suicidal or homicidal thoughts you must seek medical attention immediately by calling 911 or  calling your MD immediately  if symptoms less severe.  You Must read complete instructions/literature along with all the possible adverse reactions/side effects for all the Medicines you take and that have been prescribed to you. Take any new Medicines after you have completely understood and accpet all the possible adverse reactions/side effects.   Please note  You were cared for by a hospitalist during your hospital stay. If you have any questions about your discharge medications or the care you received while you were in the hospital after you are discharged, you can call the unit and asked to speak with the hospitalist on call if the hospitalist that took care of you is not available. Once you are discharged, your primary care physician will handle any further medical issues. Please note that NO REFILLS for any discharge medications will be authorized once you are discharged, as it is imperative that you return to your primary care physician (or establish a relationship with a primary care physician if you do not have one) for your aftercare needs so that they can reassess your need for medications and monitor your lab values.     Today   CHIEF COMPLAINT:   Chief Complaint  Patient presents with  . Emesis    HISTORY OF PRESENT ILLNESS:  Christopher Ramos is a 44 y.o. male with no known medical problems presenting to the ED with a chief complaint of intractable nausea and vomiting and passing out.. According to his wife patient drinks liquor 2-3 bottles per day and his last drink was at 7:30 AM today with other friends following which he started having intractable nausea and vomiting which was nonbloody. No diarrhea or abdominal pain according to  the wife's report. Patient is very lethargic and not answering any questions. He is arousable but falling asleep. Alcohol level is at 120s. CT head is negative. Initial troponin was also negative   VITAL SIGNS:  Blood pressure 115/73, pulse  82, temperature 98.8 F (37.1 C), temperature source Oral, resp. rate 18, height  (1.676 m), weight 75.297 kg (166 lb), SpO2 98 %.  I/O:  No intake or output data in the 24 hours ending 04/14/15 1301  PHYSICAL EXAMINATION:  GENERAL:  44 y.o.-year-old patient lying in the bed with no acute distress.  LUNGS: Normal breath sounds bilaterally, no wheezing, rales,rhonchi or crepitation. No use of accessory muscles of respiration.  CARDIOVASCULAR: S1, S2 normal. No murmurs, rubs, or gallops.  ABDOMEN: Soft, non-tender, non-distended. Bowel sounds present. No organomegaly or mass.  EXTREMITIES: No pedal edema, cyanosis, or clubbing.  NEUROLOGIC: Cranial nerves II through XII are intact. Muscle strength 5/5 in all extremities. Sensation intact. Gait not checked.  PSYCHIATRIC: The patient is alert and oriented x 3.  SKIN: No obvious rash, lesion, or ulcer.   DATA REVIEW:   CBC  Recent Labs Lab 04/14/15 0211  WBC 7.5  HGB 13.5  HCT 39.7*  PLT 221    Chemistries   Recent Labs Lab 04/14/15 0211  NA 143  K 3.6  CL 112*  CO2 24  GLUCOSE 124*  BUN 13  CREATININE 0.86  CALCIUM 7.9*  AST 19  ALT 19  ALKPHOS 71  BILITOT 0.7    Cardiac Enzymes  Recent Labs Lab 04/14/15 0729  TROPONINI <0.03    Microbiology Results  No results found for this or any previous visit.  RADIOLOGY:  Dg Chest 2 View  04/13/2015  CLINICAL DATA:  Nausea and syncope. EXAM: CHEST - 2 VIEW COMPARISON:  One-view chest x-ray 08/25/2013 FINDINGS: The heart size and mediastinal contours are within normal limits. Both lungs are clear. The visualized skeletal structures are unremarkable. IMPRESSION: Negative two view chest x-ray Electronically Signed   By: Marin Roberts M.D.   On: 04/13/2015 15:50   Ct Head Wo Contrast  04/13/2015  CLINICAL DATA:  Syncopal episode today.  Atrial fibrillation. EXAM: CT HEAD WITHOUT CONTRAST TECHNIQUE: Contiguous axial images were obtained from the base of the  skull through the vertex without intravenous contrast. COMPARISON:  CT of the head dated 12/26/2013 FINDINGS: Brain: No evidence of acute infarction, hemorrhage, extra-axial collection, ventriculomegaly, or mass effect. Vascular: No hyperdense vessel or unexpected calcification. Skull: Negative for fracture or focal lesion. Sinuses/Orbits: Minimal polypoid mucosal thickening of the ethmoid sinuses. No acute findings otherwise. Other: None. IMPRESSION: No acute intracranial abnormality. Mild chronic ethmoid sinusitis. Electronically Signed   By: Ted Mcalpine M.D.   On: 04/13/2015 15:40   Ct Angio Chest Aorta W/cm &/or Wo/cm  04/13/2015  CLINICAL DATA:  44 year old male with chest pain and persistent nausea and vomiting. EXAM: CT ANGIOGRAPHY CHEST, ABDOMEN AND PELVIS TECHNIQUE: Multidetector CT imaging through the chest, abdomen and pelvis was performed using the standard protocol during bolus administration of intravenous contrast. Multiplanar reconstructed images and MIPs were obtained and reviewed to evaluate the vascular anatomy. CONTRAST:  OMNIPAQUE IOHEXOL 350 MG/ML SOLN COMPARISON:  CT dated 12/26/2013 FINDINGS: CTA CHEST FINDINGS The lungs are clear. A small calcified granuloma noted in the right lower lobe. There is no pleural effusion or pneumothorax. The central airways are patent. The thoracic aorta appears unremarkable. No CT evidence of pulmonary embolism. There is no cardiomegaly or pericardial effusion. There  is no hilar or mediastinal adenopathy. Small amount of fluid noted in the distal esophagus, likely gastroesophageal reflux. The esophagus is otherwise grossly unremarkable. The thyroid gland appears unremarkable. There is no axillary adenopathy. The chest wall soft tissues appear unremarkable. No acute fracture Review of the MIP images confirms the above findings. CTA ABDOMEN AND PELVIS FINDINGS No intra-abdominal free air or free fluid. The liver, gallbladder, pancreas, spleen,  adrenal glands appear unremarkable. There is no hydronephrosis on either side. The visualized ureters and urinary bladder appear unremarkable. The prostate and seminal vesicles are grossly unremarkable. There is a small hiatal hernia. There scattered colonic diverticula without active inflammation. There is apparent diffuse thickening of the wall of the colon with submucosal fat deposit, likely related to chronic inflammation. No pericolonic stranding or definite evidence of active inflammation identified. Correlation with clinical exam is recommended to exclude active colitis. Is no evidence of bowel obstruction. Appendectomy. The abdominal aorta appears unremarkable. There is no aortic dissection or aneurysm. The origins of the celiac axis, SMA, IMA as well as the origins of the renal arteries are patent. 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 There is a replaced hepatic artery anatomy. The right hepatic artery arises from the SMA which then bifurcates and gives a small branch to the left lobe of the liver. There is also a small branch arising from the left gastric artery and extending into the left lobe of the liver. The IVC appears patent. No portal venous gas identified. There is no adenopathy. There is diastases of anterior abdominal wall musculature in the midline with a small fat containing umbilical hernia. The abdominal wall soft tissues are otherwise unremarkable. The osseous structures are intact. Review of the MIP images confirms the above findings. IMPRESSION: No CT evidence of pulmonary embolism or aortic dissection. Apparent diffuse thickening of the colon with submucosal fat deposit which may be sequela of chronic inflammation. Clinical correlation is recommended to exclude active colitis. Scattered colonic diverticula without active inflammation. No bowel obstruction. Electronically Signed   By: Elgie Collard M.D.   On: 04/13/2015 19:04   Ct Cta  Abd/pel W/cm &/or W/o Cm  04/13/2015  CLINICAL DATA:  44 year old male with chest pain and persistent nausea and vomiting. EXAM: CT ANGIOGRAPHY CHEST, ABDOMEN AND PELVIS TECHNIQUE: Multidetector CT imaging through the chest, abdomen and pelvis was performed using the standard protocol during bolus administration of intravenous contrast. Multiplanar reconstructed images and MIPs were obtained and reviewed to evaluate the vascular anatomy. CONTRAST:  OMNIPAQUE IOHEXOL 350 MG/ML SOLN COMPARISON:  CT dated 12/26/2013 FINDINGS: CTA CHEST FINDINGS The lungs are clear. A small calcified granuloma noted in the right lower lobe. There is no pleural effusion or pneumothorax. The central airways are patent. The thoracic aorta appears unremarkable. No CT evidence of pulmonary embolism. There is no cardiomegaly or pericardial effusion. There is no hilar or mediastinal adenopathy. Small amount of fluid noted in the distal esophagus, likely gastroesophageal reflux. The esophagus is otherwise grossly unremarkable. The thyroid gland appears unremarkable. There is no axillary adenopathy. The chest wall soft tissues appear unremarkable. No acute fracture Review of the MIP images confirms the above findings. CTA ABDOMEN AND PELVIS FINDINGS No intra-abdominal free air or free fluid. The liver, gallbladder, pancreas, spleen, adrenal glands appear unremarkable. There is no hydronephrosis on either side. The visualized ureters and urinary bladder appear unremarkable. The prostate and seminal vesicles are grossly unremarkable. There is a small hiatal hernia.  There scattered colonic diverticula without active inflammation. There is apparent diffuse thickening of the wall of the colon with submucosal fat deposit, likely related to chronic inflammation. No pericolonic stranding or definite evidence of active inflammation identified. Correlation with clinical exam is recommended to exclude active colitis. Is no evidence of bowel  obstruction. Appendectomy. The abdominal aorta appears unremarkable. There is no aortic dissection or aneurysm. The origins of the celiac axis, SMA, IMA as well as the origins of the renal arteries are patent. 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 There is a replaced hepatic artery anatomy. The right hepatic artery arises from the SMA which then bifurcates and gives a small branch to the left lobe of the liver. There is also a small branch arising from the left gastric artery and extending into the left lobe of the liver. The IVC appears patent. No portal venous gas identified. There is no adenopathy. There is diastases of anterior abdominal wall musculature in the midline with a small fat containing umbilical hernia. The abdominal wall soft tissues are otherwise unremarkable. The osseous structures are intact. Review of the MIP images confirms the above findings. IMPRESSION: No CT evidence of pulmonary embolism or aortic dissection. Apparent diffuse thickening of the colon with submucosal fat deposit which may be sequela of chronic inflammation. Clinical correlation is recommended to exclude active colitis. Scattered colonic diverticula without active inflammation. No bowel obstruction. Electronically Signed   By: Elgie CollardArash  Radparvar M.D.   On: 04/13/2015 19:04    EKG:   Orders placed or performed during the hospital encounter of 12/26/13  . EKG 12-Lead  . EKG 12-Lead  . EKG      Management plans discussed with the patient, family and they are in agreement.  CODE STATUS:     Code Status Orders        Start     Ordered   04/14/15 0144  Full code   Continuous     04/14/15 0143      TOTAL TIME TAKING CARE OF THIS PATIENT: 35 minutes.  Greater than 50% of time spent in care coordination and counseling.  Elby ShowersWALSH, CATHERINE M.D on 04/14/2015 at 1:01 PM  Between 7am to 6pm - Pager - [phone]  After 6pm go to [email or web address] - password [email or web address]  [facility]  [facility]  Office  [phone]  CC: Primary care physician; No primary care provider on file.

## 2015-04-14 NOTE — Progress Notes (Signed)
*  PRELIMINARY RESULTS* Echocardiogram 2D Echocardiogram has been performed.  Georgann HousekeeperJerry R Hege 04/14/2015, 8:13 AM

## 2017-04-11 IMAGING — CT CT HEAD W/O CM
1 series · 16 of 30 positions shown, 20 images · non-contrast
Comparison: CT of the head dated 12/26/2013

CLINICAL DATA: Syncopal episode today.  Atrial fibrillation.

EXAM:
CT HEAD WITHOUT CONTRAST
TECHNIQUE: Contiguous axial images were obtained from the base of the skull
through the vertex without intravenous contrast.

[Series 2: head wo · axial · 0.40mm/px · z∈[+102,+228]mm · 16 of 32 slices shown, 20 images]
[im 2/32  brain]
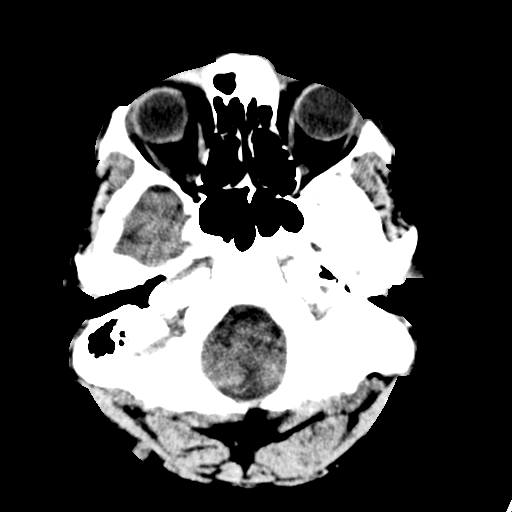
[im 2/32  bone]
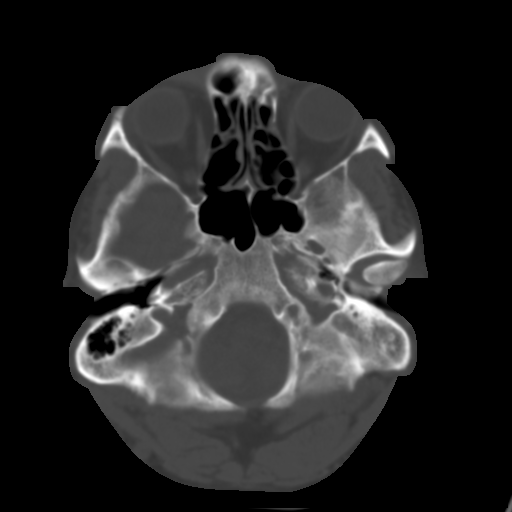
[im 4/32  brain]
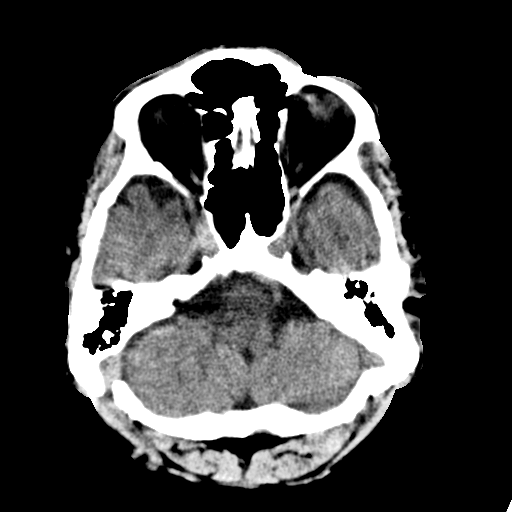
[im 6/32  brain]
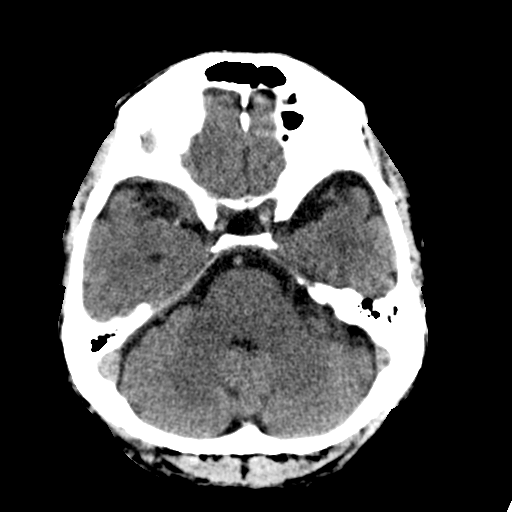
[im 8/32  brain]
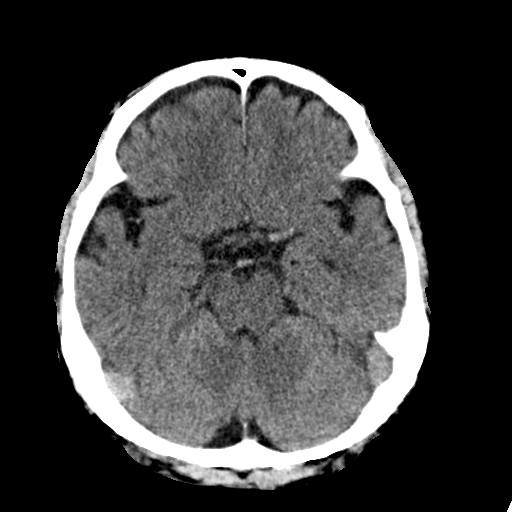
[im 9/32  brain]
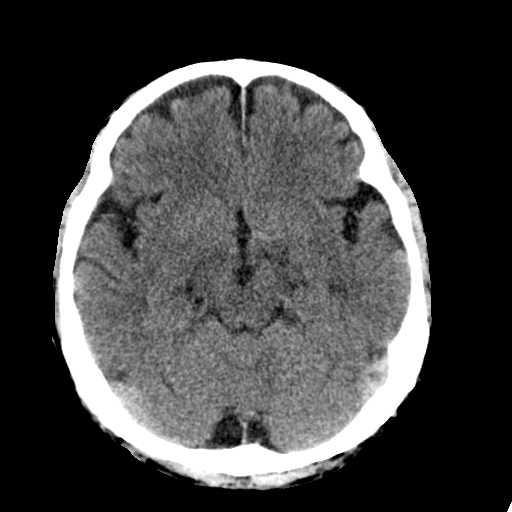
[im 9/32  bone]
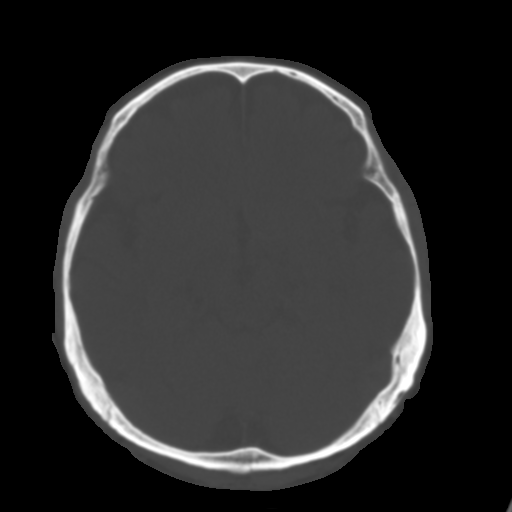
[im 11/32  brain]
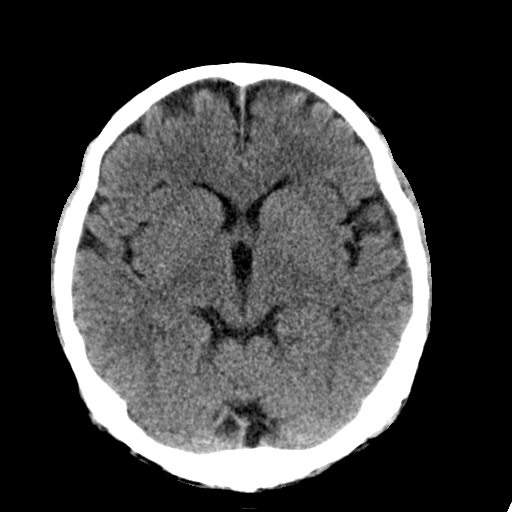
[im 13/32  brain]
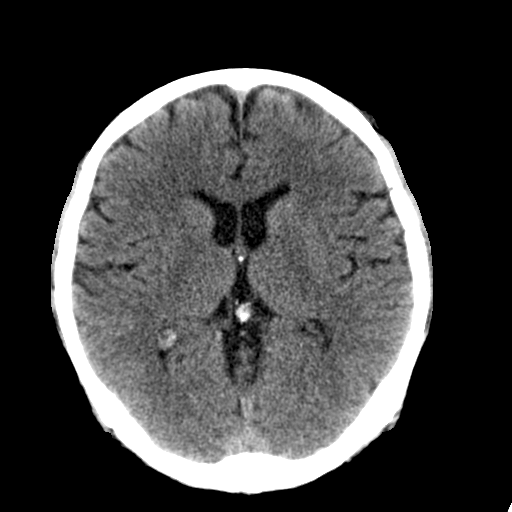
[im 15/32  brain]
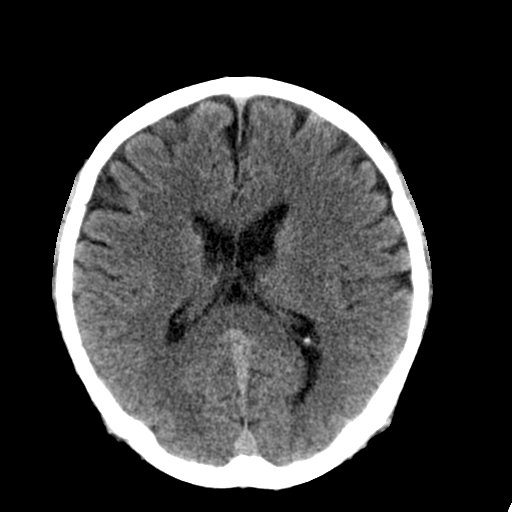
[im 17/32  brain]
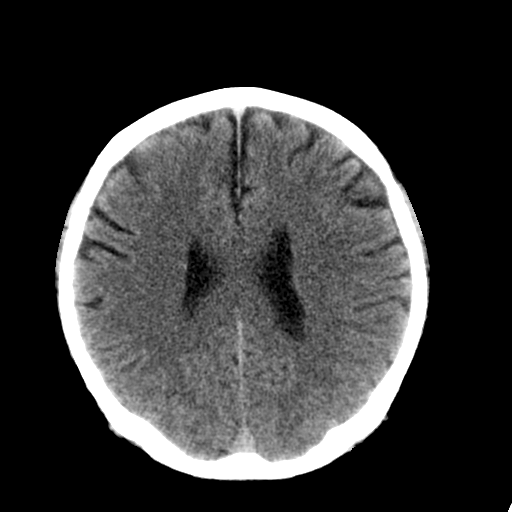
[im 17/32  bone]
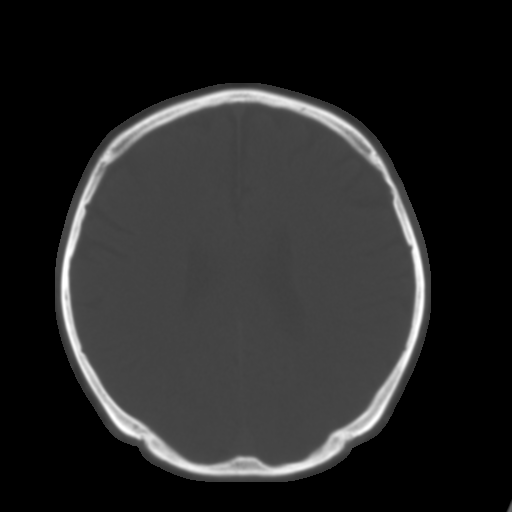
[im 19/32  brain]
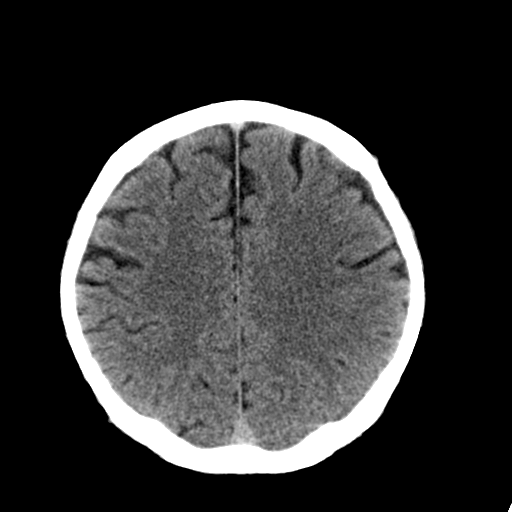
[im 21/32  brain]
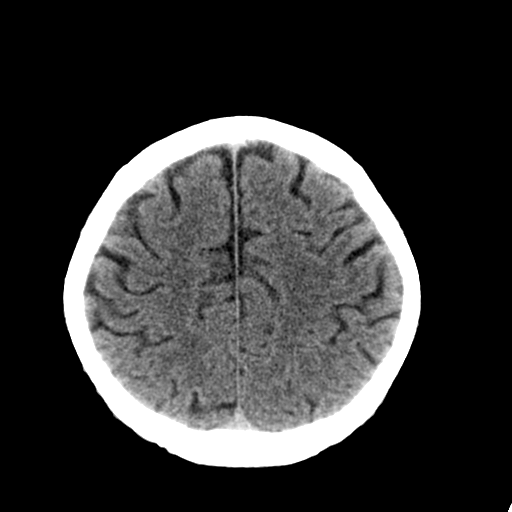
[im 23/32  brain]
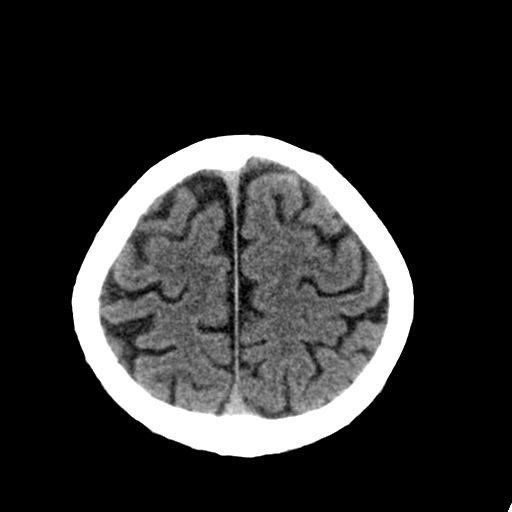
[im 24/32  brain]
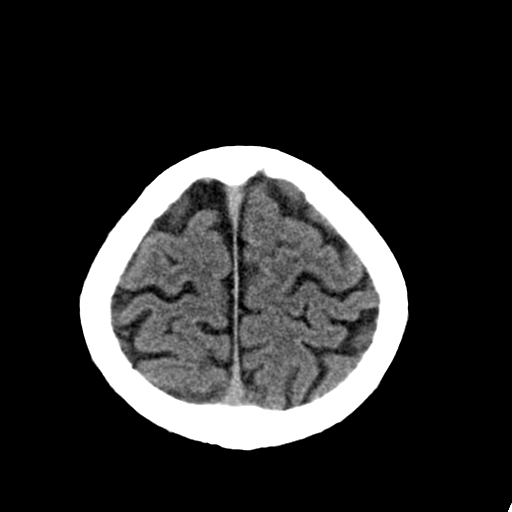
[im 24/32  bone]
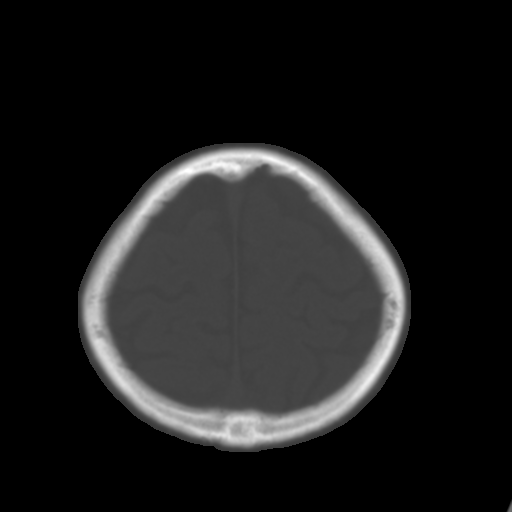
[im 26/32  brain]
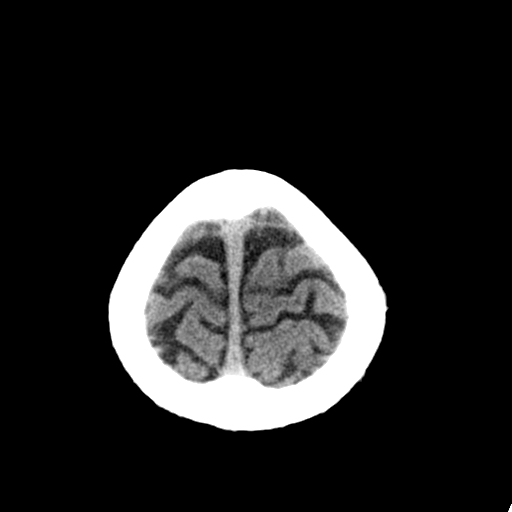
[im 28/32  brain]
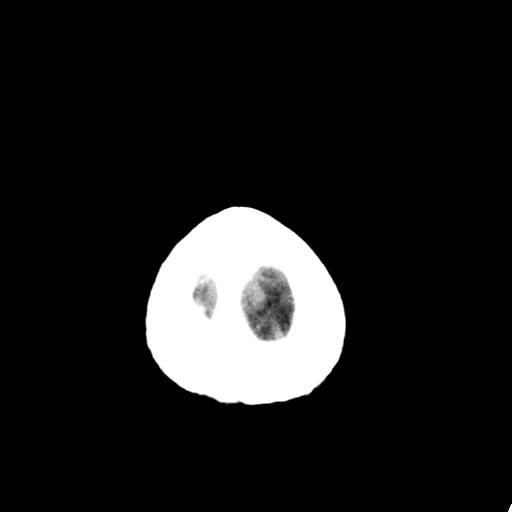
[im 30/32  brain]
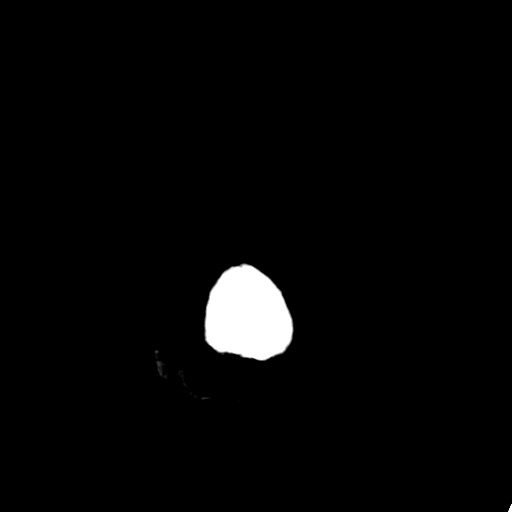

[16 of 30 positions shown; findings below may reference images not displayed]

FINDINGS: Brain: No evidence of acute infarction, hemorrhage, extra-axial
collection, ventriculomegaly, or mass effect.

Vascular: No hyperdense vessel or unexpected calcification.

Skull: Negative for fracture or focal lesion.

Sinuses/Orbits: Minimal polypoid mucosal thickening of the ethmoid
sinuses. No acute findings otherwise.

Other: None.
IMPRESSION: No acute intracranial abnormality.

Mild chronic ethmoid sinusitis.

## 2017-04-11 IMAGING — CT CT CTA ABD/PEL W/CM AND/OR W/O CM
1 of 2 series · 14 of 32 positions shown, 18 images · IV contrast (APPLIED)
Comparison: CT dated 12/26/2013

CLINICAL DATA: 44-year-old male with chest pain and persistent
nausea and vomiting.

EXAM:
CT ANGIOGRAPHY CHEST, ABDOMEN AND PELVIS
TECHNIQUE: Multidetector CT imaging through the chest, abdomen and pelvis was
performed using the standard protocol during bolus administration of
intravenous contrast. Multiplanar reconstructed images and MIPs were
obtained and reviewed to evaluate the vascular anatomy.
CONTRAST:  100mL OMNIPAQUE IOHEXOL 350 MG/ML SOLN

[Series 6: cta chest · axial · 0.66mm/px · z∈[-613,-63]mm · 14 of 311 slices shown, 18 images]
[im 24/311  soft-tissue]
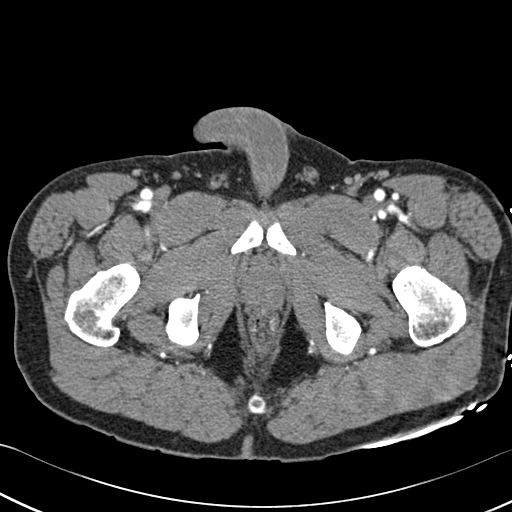
[im 24/311  bone]
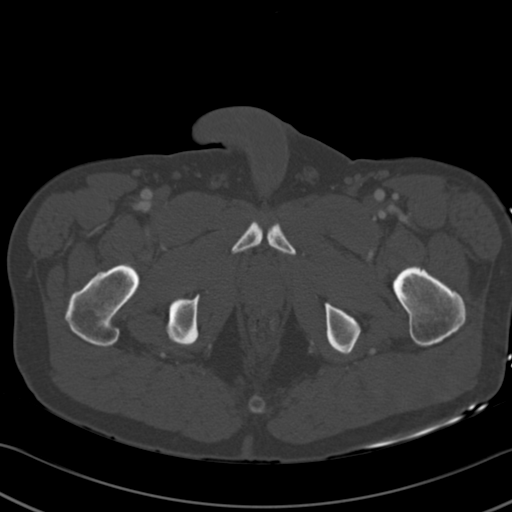
[im 48/311  soft-tissue]
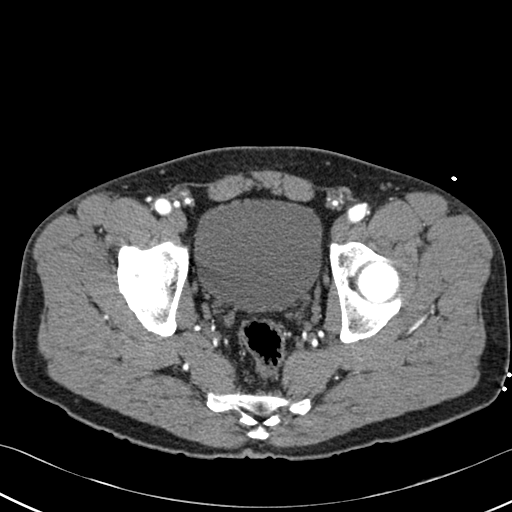
[im 72/311  soft-tissue]
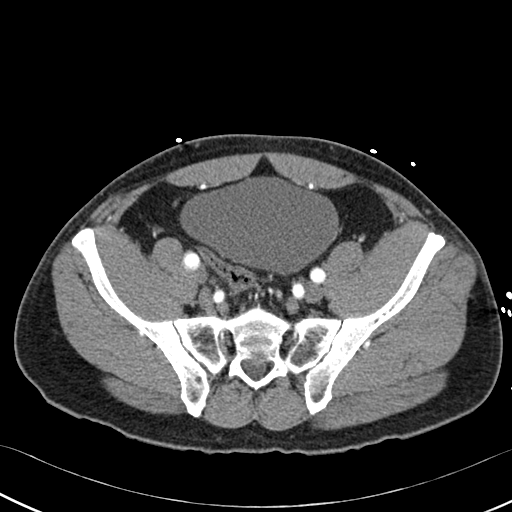
[im 96/311  soft-tissue]
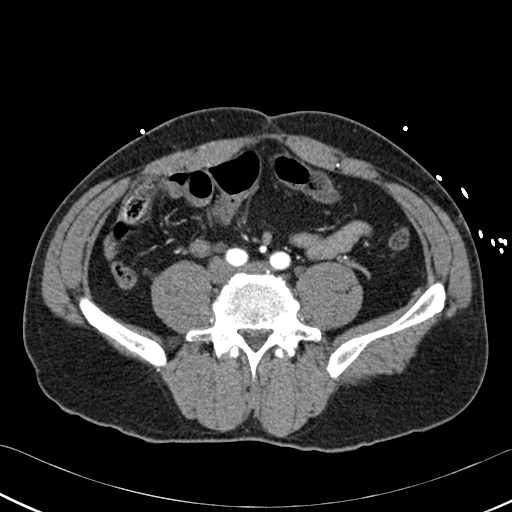
[im 120/311  soft-tissue]
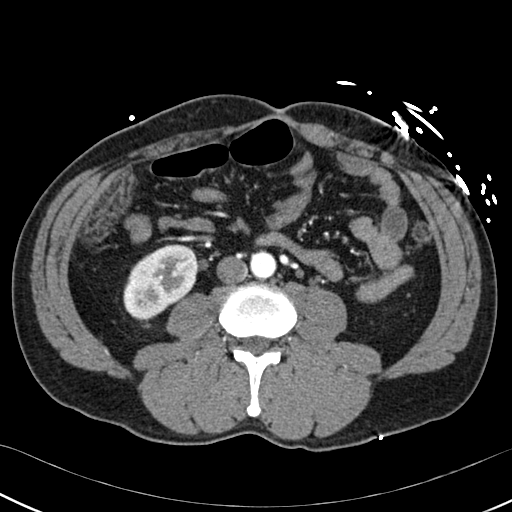
[im 144/311  soft-tissue]
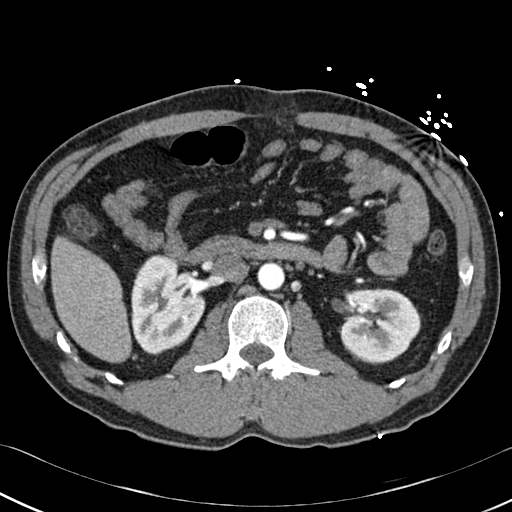
[im 167/311  soft-tissue]
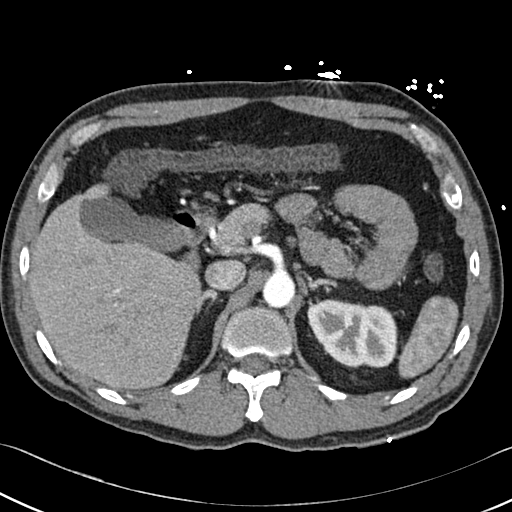
[im 191/311  soft-tissue]
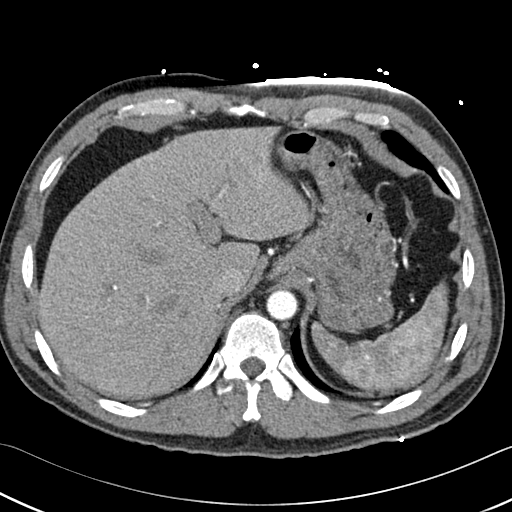
[im 215/311  soft-tissue]
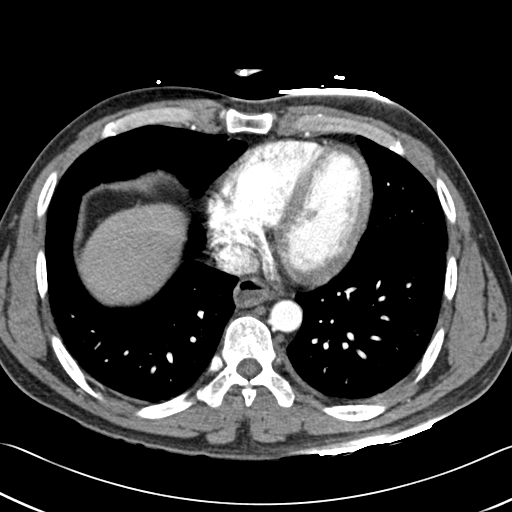
[im 215/311  bone]
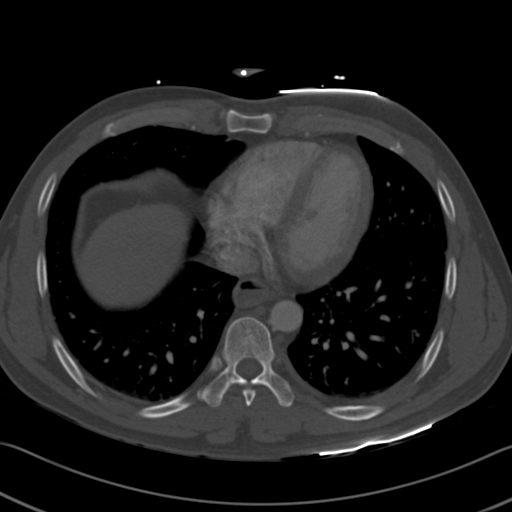
[im 239/311  soft-tissue]
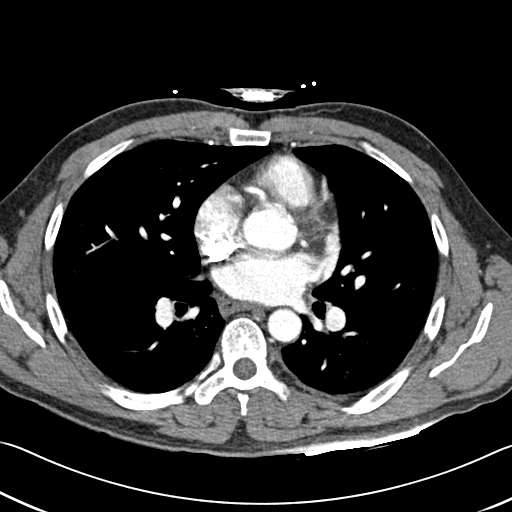
[im 263/311  soft-tissue]
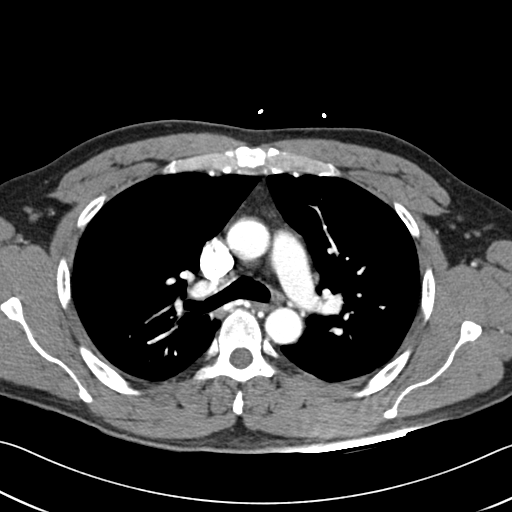
[im 263/311  lung]
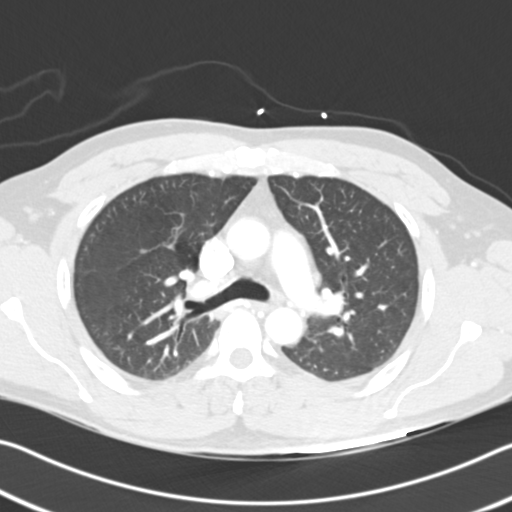
[im 275/311  lung]
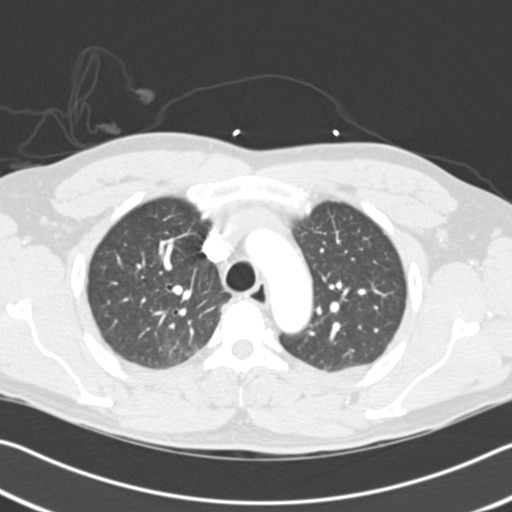
[im 287/311  soft-tissue]
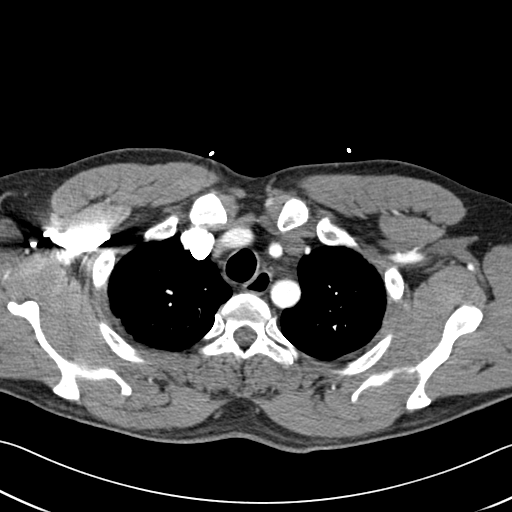
[im 287/311  lung]
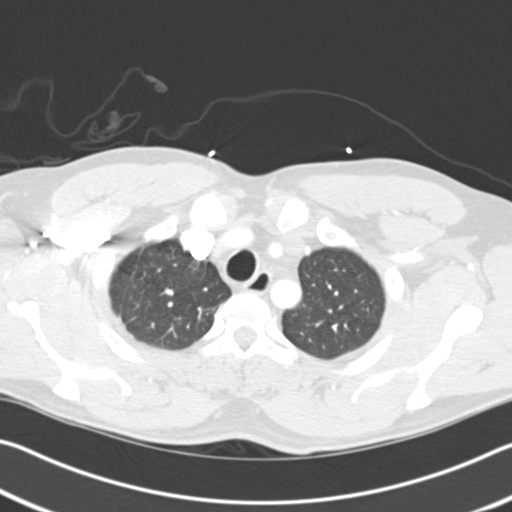
[im 299/311  lung]
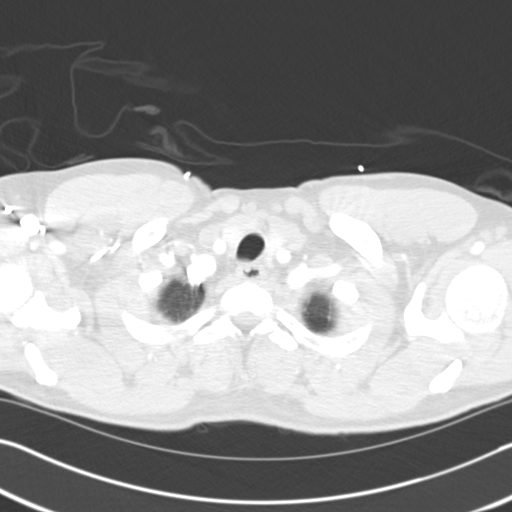

[14 of 32 positions shown; findings below may reference images not displayed]

FINDINGS: CTA CHEST FINDINGS

The lungs are clear. A small calcified granuloma noted in the right
lower lobe. There is no pleural effusion or pneumothorax. The
central airways are patent.

The thoracic aorta appears unremarkable. No CT evidence of pulmonary
embolism. There is no cardiomegaly or pericardial effusion. There is
no hilar or mediastinal adenopathy.

Small amount of fluid noted in the distal esophagus, likely
gastroesophageal reflux. The esophagus is otherwise grossly
unremarkable. The thyroid gland appears unremarkable. There is no
axillary adenopathy. The chest wall soft tissues appear
unremarkable. No acute fracture

Review of the MIP images confirms the above findings.

CTA ABDOMEN AND PELVIS FINDINGS

No intra-abdominal free air or free fluid.

The liver, gallbladder, pancreas, spleen, adrenal glands appear
unremarkable. There is no hydronephrosis on either side. The
visualized ureters and urinary bladder appear unremarkable. The
prostate and seminal vesicles are grossly unremarkable.

There is a small hiatal hernia. There scattered colonic diverticula
without active inflammation. There is apparent diffuse thickening of
the wall of the colon with submucosal fat deposit, likely related to
chronic inflammation. No pericolonic stranding or definite evidence
of active inflammation identified. Correlation with clinical exam is
recommended to exclude active colitis. Is no evidence of bowel
obstruction. Appendectomy.

The abdominal aorta appears unremarkable. There is no aortic
dissection or aneurysm. The origins of the celiac axis, SMA, IMA as
well as the origins of the renal arteries are patent. There is a
circumaortic left renal vein. There is duplication of the left renal
artery. There is a replaced hepatic artery anatomy. The right
hepatic artery arises from the SMA which then bifurcates and gives a
small branch to the left lobe of the liver. There is also a small
branch arising from the left gastric artery and extending into the
left lobe of the liver. The IVC appears patent. No portal venous gas
identified. There is no adenopathy.

There is diastases of anterior abdominal wall musculature in the
midline with a small fat containing umbilical hernia. The abdominal
wall soft tissues are otherwise unremarkable. The osseous structures
are intact.

Review of the MIP images confirms the above findings.
IMPRESSION: No CT evidence of pulmonary embolism or aortic dissection.

Apparent diffuse thickening of the colon with submucosal fat deposit
which may be sequela of chronic inflammation. Clinical correlation
is recommended to exclude active colitis.

Scattered colonic diverticula without active inflammation. No bowel
obstruction.
